# Patient Record
Sex: Male | Born: 1960 | Race: White | Hispanic: No | Marital: Single | Smoking: Current some day smoker
Health system: Southern US, Community
[De-identification: ages and names within clinical notes are randomized; demographics above are authoritative.]

## PROBLEM LIST (undated history)

## (undated) DIAGNOSIS — I2510 Atherosclerotic heart disease of native coronary artery without angina pectoris: Secondary | ICD-10-CM

## (undated) DIAGNOSIS — I739 Peripheral vascular disease, unspecified: Secondary | ICD-10-CM

---

## 2016-04-19 ENCOUNTER — Emergency Department (HOSPITAL_COMMUNITY): Payer: Self-pay

## 2016-04-19 ENCOUNTER — Encounter (HOSPITAL_COMMUNITY): Payer: Self-pay | Admitting: Emergency Medicine

## 2016-04-19 ENCOUNTER — Observation Stay (HOSPITAL_COMMUNITY)
Admission: EM | Admit: 2016-04-19 | Discharge: 2016-04-20 | Disposition: A | Discharge status: Home / Self Care | Payer: Self-pay | Attending: Internal Medicine | Admitting: Internal Medicine

## 2016-04-19 ENCOUNTER — Telehealth: Payer: Self-pay | Admitting: Student

## 2016-04-19 DIAGNOSIS — Z7982 Long term (current) use of aspirin: Secondary | ICD-10-CM | POA: Insufficient documentation

## 2016-04-19 DIAGNOSIS — R079 Chest pain, unspecified: Secondary | ICD-10-CM | POA: Diagnosis present

## 2016-04-19 DIAGNOSIS — Z720 Tobacco use: Secondary | ICD-10-CM | POA: Diagnosis present

## 2016-04-19 DIAGNOSIS — R7303 Prediabetes: Secondary | ICD-10-CM | POA: Insufficient documentation

## 2016-04-19 DIAGNOSIS — Z8249 Family history of ischemic heart disease and other diseases of the circulatory system: Secondary | ICD-10-CM | POA: Insufficient documentation

## 2016-04-19 DIAGNOSIS — Z86718 Personal history of other venous thrombosis and embolism: Secondary | ICD-10-CM | POA: Insufficient documentation

## 2016-04-19 DIAGNOSIS — I2584 Coronary atherosclerosis due to calcified coronary lesion: Secondary | ICD-10-CM | POA: Insufficient documentation

## 2016-04-19 DIAGNOSIS — I200 Unstable angina: Secondary | ICD-10-CM | POA: Diagnosis present

## 2016-04-19 DIAGNOSIS — Z7902 Long term (current) use of antithrombotics/antiplatelets: Secondary | ICD-10-CM | POA: Insufficient documentation

## 2016-04-19 DIAGNOSIS — Z955 Presence of coronary angioplasty implant and graft: Secondary | ICD-10-CM | POA: Insufficient documentation

## 2016-04-19 DIAGNOSIS — I700 Atherosclerosis of aorta: Secondary | ICD-10-CM | POA: Insufficient documentation

## 2016-04-19 DIAGNOSIS — I739 Peripheral vascular disease, unspecified: Secondary | ICD-10-CM | POA: Diagnosis present

## 2016-04-19 DIAGNOSIS — F17210 Nicotine dependence, cigarettes, uncomplicated: Secondary | ICD-10-CM | POA: Insufficient documentation

## 2016-04-19 DIAGNOSIS — I252 Old myocardial infarction: Secondary | ICD-10-CM

## 2016-04-19 DIAGNOSIS — Z8673 Personal history of transient ischemic attack (TIA), and cerebral infarction without residual deficits: Secondary | ICD-10-CM

## 2016-04-19 DIAGNOSIS — I25110 Atherosclerotic heart disease of native coronary artery with unstable angina pectoris: Principal | ICD-10-CM | POA: Insufficient documentation

## 2016-04-19 DIAGNOSIS — I2510 Atherosclerotic heart disease of native coronary artery without angina pectoris: Secondary | ICD-10-CM

## 2016-04-19 HISTORY — DX: Atherosclerotic heart disease of native coronary artery without angina pectoris: I25.10

## 2016-04-19 HISTORY — DX: Peripheral vascular disease, unspecified: I73.9

## 2016-04-19 LAB — CBC
HEMATOCRIT: 41.2 % (ref 39.0–52.0)
Hemoglobin: 14.1 g/dL (ref 13.0–17.0)
MCH: 31.5 pg (ref 26.0–34.0)
MCHC: 34.2 g/dL (ref 30.0–36.0)
MCV: 92 fL (ref 78.0–100.0)
Platelets: 240 10*3/uL (ref 150–400)
RBC: 4.48 MIL/uL (ref 4.22–5.81)
RDW: 13 % (ref 11.5–15.5)
WBC: 10.7 10*3/uL — AB (ref 4.0–10.5)

## 2016-04-19 LAB — TROPONIN I

## 2016-04-19 LAB — BASIC METABOLIC PANEL
Anion gap: 9 (ref 5–15)
BUN: 18 mg/dL (ref 6–20)
CHLORIDE: 108 mmol/L (ref 101–111)
CO2: 23 mmol/L (ref 22–32)
Calcium: 8.7 mg/dL — ABNORMAL LOW (ref 8.9–10.3)
Creatinine, Ser: 0.9 mg/dL (ref 0.61–1.24)
GFR calc non Af Amer: >60 mL/min (ref >60)
Glucose, Bld: 128 mg/dL — ABNORMAL HIGH (ref 65–99)
POTASSIUM: 4.1 mmol/L (ref 3.5–5.1)
SODIUM: 140 mmol/L (ref 135–145)

## 2016-04-19 LAB — D-DIMER, QUANTITATIVE (NOT AT ARMC): D DIMER QUANT: 0.58 ug{FEU}/mL — AB (ref 0.00–0.50)

## 2016-04-19 LAB — I-STAT TROPONIN, ED: Troponin i, poc: 0.02 ng/mL (ref 0.00–0.08)

## 2016-04-19 LAB — HEPARIN LEVEL (UNFRACTIONATED): Heparin Unfractionated: 0.18 IU/mL — ABNORMAL LOW (ref 0.30–0.70)

## 2016-04-19 MED ORDER — CLOPIDOGREL BISULFATE 75 MG PO TABS
75.0000 mg | ORAL_TABLET | Freq: Every day | ORAL | Status: DC
  Administered 2016-04-19 – 2016-04-20 (×2): 75 mg via ORAL
  Filled 2016-04-19 (×3): qty 1

## 2016-04-19 MED ORDER — NITROGLYCERIN 0.4 MG SL SUBL
0.4000 mg | SUBLINGUAL_TABLET | SUBLINGUAL | Status: DC | PRN
  Administered 2016-04-19: 0.4 mg via SUBLINGUAL
  Filled 2016-04-19: qty 1

## 2016-04-19 MED ORDER — SODIUM CHLORIDE 0.9 % IV SOLN
INTRAVENOUS | Status: DC
  Administered 2016-04-20: 06:00:00 via INTRAVENOUS

## 2016-04-19 MED ORDER — SODIUM CHLORIDE 0.9% FLUSH: 3.0000 mL | INTRAVENOUS | Status: DC | PRN

## 2016-04-19 MED ORDER — METOPROLOL TARTRATE 12.5 MG HALF TABLET
12.5000 mg | ORAL_TABLET | Freq: Two times a day (BID) | ORAL | Status: DC
  Administered 2016-04-20: 12.5 mg via ORAL
  Filled 2016-04-19: qty 1

## 2016-04-19 MED ORDER — HEPARIN (PORCINE) IN NACL 100-0.45 UNIT/ML-% IJ SOLN
1300.0000 [IU]/h | INTRAMUSCULAR | Status: DC
  Administered 2016-04-19: 950 [IU]/h via INTRAVENOUS
  Administered 2016-04-20: 1300 [IU]/h via INTRAVENOUS
  Filled 2016-04-19 (×2): qty 250

## 2016-04-19 MED ORDER — SODIUM CHLORIDE 0.9 % IV SOLN
250.0000 mL | INTRAVENOUS | Status: DC | PRN
Start: 2016-04-19 — End: 2016-04-20

## 2016-04-19 MED ORDER — ENOXAPARIN SODIUM 40 MG/0.4ML ~~LOC~~ SOLN: 40.0000 mg | SUBCUTANEOUS | Status: DC

## 2016-04-19 MED ORDER — ASPIRIN EC 81 MG PO TBEC
81.0000 mg | DELAYED_RELEASE_TABLET | Freq: Every day | ORAL | Status: DC
  Administered 2016-04-19 – 2016-04-20 (×2): 81 mg via ORAL
  Filled 2016-04-19 (×3): qty 1

## 2016-04-19 MED ORDER — SODIUM CHLORIDE 0.9 % IV SOLN
INTRAVENOUS | Status: AC
  Administered 2016-04-19: 09:00:00 via INTRAVENOUS

## 2016-04-19 MED ORDER — IOPAMIDOL (ISOVUE-370) INJECTION 76%
INTRAVENOUS | Status: AC
  Administered 2016-04-19: 100 mL
  Filled 2016-04-19: qty 100

## 2016-04-19 MED ORDER — SODIUM CHLORIDE 0.9% FLUSH: 3.0000 mL | Freq: Two times a day (BID) | INTRAVENOUS | Status: DC

## 2016-04-19 MED ORDER — HEPARIN BOLUS VIA INFUSION
4000.0000 [IU] | Freq: Once | INTRAVENOUS | Status: AC
Start: 2016-04-19 — End: 2016-04-19
  Administered 2016-04-19: 4000 [IU] via INTRAVENOUS
  Filled 2016-04-19: qty 4000

## 2016-04-19 MED ORDER — HEPARIN BOLUS VIA INFUSION
2000.0000 [IU] | Freq: Once | INTRAVENOUS | Status: AC
  Administered 2016-04-19: 2000 [IU] via INTRAVENOUS
  Filled 2016-04-19: qty 2000

## 2016-04-19 NOTE — Telephone Encounter (Signed)
New message    Pt did not have a heart Cath in 2004, she only had one in 2011, she asked if it was okay to send the heart cath from then, and I told her yes.

## 2016-04-19 NOTE — ED Provider Notes (Signed)
[facility]-EMERGENCY DEPT Provider Note   CSN: [id number] Arrival date & time: 04/19/16  0347     History   Chief Complaint Chief Complaint  Patient presents with  . Chest Pain    HPI Andrew Sherman is a 56 y.o. male.  Patient presents to the emergency department for evaluation of chest pain. Patient reports that he was awakened from sleep by chest pain. He cannot describe the pain, just a severe pain that was 10 out of 10, left upper chest. He reports associated diaphoresis and shortness of breath. Patient reports a previous history of MI. He was given nitroglycerin by EMS and has had significant improvement of the pain. He now reports that the pain only occurs when he takes a deep breath.      Past Medical History:  Diagnosis Date  . Myocardial infarction     There are no active problems to display for this patient.   No past surgical history on file.     Home Medications    Prior to Admission medications   Medication Sig Start Date End Date Taking? Authorizing Provider  aspirin EC 81 MG tablet Take 81 mg by mouth daily.   Yes Historical Provider, MD  clopidogrel (PLAVIX) 75 MG tablet Take 75 mg by mouth daily.   Yes Historical Provider, MD    Family History No family history on file.  Social History Social History  Substance Use Topics  . Smoking status: Current Some Day Smoker    Packs/day: 0.50    Types: Cigarettes  . Smokeless tobacco: Never Used  . Alcohol use 3.6 oz/week    6 Cans of beer per week     Allergies   Patient has no known allergies.   Review of Systems Review of Systems  Constitutional: Positive for diaphoresis.  Respiratory: Positive for shortness of breath.   Cardiovascular: Positive for chest pain.  All other systems reviewed and are negative.    Physical Exam Updated Vital Signs BP 128/78   Pulse 82   Temp 97.8 F (36.6 C) (Oral)   Resp (!) 23   SpO2 99%   Physical Exam  Constitutional: He is oriented to person, place,  and time. He appears well-developed and well-nourished. No distress.  HENT:  Head: Normocephalic and atraumatic.  Right Ear: Hearing normal.  Left Ear: Hearing normal.  Nose: Nose normal.  Mouth/Throat: Oropharynx is clear and moist and mucous membranes are normal.  Eyes: Conjunctivae and EOM are normal. Pupils are equal, round, and reactive to light.  Neck: Normal range of motion. Neck supple.  Cardiovascular: Regular rhythm, S1 normal and S2 normal.  Exam reveals no gallop and no friction rub.   No murmur heard. Pulmonary/Chest: Effort normal and breath sounds normal. No respiratory distress. He exhibits no tenderness.  Abdominal: Soft. Normal appearance and bowel sounds are normal. There is no hepatosplenomegaly. There is no tenderness. There is no rebound, no guarding, no tenderness at McBurney's point and negative Murphy's sign. No hernia.  Musculoskeletal: Normal range of motion.  Neurological: He is alert and oriented to person, place, and time. He has normal strength. No cranial nerve deficit or sensory deficit. Coordination normal. GCS eye subscore is 4. GCS verbal subscore is 5. GCS motor subscore is 6.  Skin: Skin is warm, dry and intact. No rash noted. No cyanosis.  Psychiatric: He has a normal mood and affect. His speech is normal and behavior is normal. Thought content normal.  Nursing note and vitals reviewed.  ED Treatments / Results  Labs (all labs ordered are listed, but only abnormal results are displayed) Labs Reviewed  BASIC METABOLIC PANEL - Abnormal; Notable for the following:       Result Value   Glucose, Bld 128 (*)    Calcium 8.7 (*)    All other components within normal limits  CBC - Abnormal; Notable for the following:    WBC 10.7 (*)    All other components within normal limits  D-DIMER, QUANTITATIVE (NOT AT [facility]) - Abnormal; Notable for the following:    D-Dimer, Quant 0.58 (*)    All other components within normal limits  I-STAT TROPOININ, ED     EKG  EKG Interpretation  Date/Time:  Thursday April 19 2016 03:47:48 EDT Ventricular Rate:  78 PR Interval:  166 QRS Duration: 72 QT Interval:  384 QTC Calculation: 437 R Axis:   6 Text Interpretation:  Normal sinus rhythm Normal ECG Confirmed by Blinda Leatherwood  MD, CHRISTOPHER ([id number]) on 04/19/2016 4:02:18 AM       Radiology Dg Chest 2 View  Result Date: 04/19/2016 CLINICAL DATA:  Mid chest pain tonight. EXAM: CHEST  2 VIEW COMPARISON:  None. FINDINGS: The cardiomediastinal contours are normal. The lungs are clear. Pulmonary vasculature is normal. No consolidation, pleural effusion, or pneumothorax. No acute osseous abnormalities are seen. Mild degenerative change in the thoracic spine. IMPRESSION: No acute abnormality. Electronically Signed   By: Rubye Oaks M.D.   On: 04/19/2016 05:19   Ct Angio Chest Pe W Or Wo Contrast  Result Date: 04/19/2016 CLINICAL DATA:  Chest pain and shortness of breath. History of right leg DVT. EXAM: CT ANGIOGRAPHY CHEST WITH CONTRAST TECHNIQUE: Multidetector CT imaging of the chest was performed using the standard protocol during bolus administration of intravenous contrast. Multiplanar CT image reconstructions and MIPs were obtained to evaluate the vascular anatomy. CONTRAST:  100 cc Isovue 370 IV COMPARISON:  Chest radiograph earlier this day FINDINGS: Cardiovascular: There are no filling defects within the pulmonary arteries to suggest pulmonary embolus. The thoracic aorta is normal in caliber. Minimal atherosclerosis. Coronary artery calcifications are seen. Mediastinum/Nodes: Calcified mediastinal on nodes left hilar lymph nodes consistent prior granulomatous disease. No noncalcified adenopathy. Visualized thyroid gland is normal. The esophagus is decompressed. Lungs/Pleura: Linear atelectasis in the left lower lobe. Mild central bronchial thickening. No consolidation or evidence pulmonary edema. No pleural fluid. No pulmonary mass or suspicious nodule.  Calcified granuloma in the left lower lobe. Upper Abdomen: Calcified splenic granuloma.  No acute abnormality. Musculoskeletal: There are no acute or suspicious osseous abnormalities. Degenerative change in the thoracic spine. Review of the MIP images confirms the above findings. IMPRESSION: 1. No pulmonary embolus. 2. Mild bronchial thickening. 3. Coronary artery calcifications. Mild atherosclerosis of the thoracic aorta. Electronically Signed   By: Rubye Oaks M.D.   On: 04/19/2016 06:41    Procedures Procedures (including critical care time)  Medications Ordered in ED Medications  iopamidol (ISOVUE-370) 76 % injection (100 mLs  Contrast Given 04/19/16 0622)     Initial Impression / Assessment and Plan / ED Course  I have reviewed the triage vital signs and the nursing notes.  Pertinent labs & imaging results that were available during my care of the patient were reviewed by me and considered in my medical decision making (see chart for details).     Patient presents to the emergency department for evaluation of chest pain. Patient does have a history of coronary artery disease. He reports previous cardiac stenting  in 2004. He does report that he had a stress test 1 or 2 years ago that did not show any abnormalities. This is reassuring. Patient did have most of his pain resolved with administration of aspirin and nitroglycerin by EMS. He still had some pleuritic pain here in the ER. D-dimer was slightly elevated, CT angiography performed. No PE noted. He does have cardiac calcifications. Patient with moderate risk, requires hospitalization for further cardiac evaluation.  Final Clinical Impressions(s) / ED Diagnoses   Final diagnoses:  Chest pain, unspecified type    New Prescriptions New Prescriptions   No medications on file     Gilda Creasehristopher J Pollina, MD 04/19/16 [phone]

## 2016-04-19 NOTE — ED Triage Notes (Signed)
Per EMS pt awoke from sleep approximately 2 hours ago with 10/10 chest pain.  Shortness of breath with movement.  Left sided, no radiation, no nausea or dizziness.  On plavix d/t previous clots in legs. History of MI in 2004.  Pt here from out of town for work.  324 ASA and 3 nitro given en route. Pain down to 7/10.  Smokes 1/2 pack per day.

## 2016-04-19 NOTE — Telephone Encounter (Signed)
SPOKE TO BRITTANY STRADER- PATIENT IS IN HOSPITAL AT PRESENT TIME BRITTANY STATES SHE HAS THE INFORMATION NOW

## 2016-04-19 NOTE — Progress Notes (Signed)
ANTICOAGULATION CONSULT NOTE  Pharmacy Consult for heparin Indication: chest pain/ACS  Heparin Dosing Weight: 77.1 kg   Assessment: 56 yom presenting with CP. Pharmacy consulted to dose heparin. PMH of "blood clots in legs" requiring stenting to both legs in 2004. Not on anticoagulation PTA. D-dimer 0.58. CTA negative for PE. Troponin negative. CBC wnl. No bleed documented. Estimated wt ~170 lbs in the ED.  Initial heparin level below goal at 0.18.   Goal of Therapy:  Heparin level 0.3-0.7 units/ml Monitor platelets by anticoagulation protocol: Yes   Plan:  Heparin 2000 unit bolus Increase heparin to 1100 units/hr 6hr heparin level Daily heparin level/CBC Monitor s/sx bleeding  Andrew CoilFrank Jaala Sherman PharmD., BCPS Clinical Pharmacist Pager [phone] 04/19/2016 5:16 PM

## 2016-04-19 NOTE — H&P (Signed)
Date: 04/19/2016               Patient Name:  Andrew Sherman MRN: [id number]  DOB: November 27, 1960 Age / Sex: 56 y.o., male   PCP: No Pcp Per Patient         Medical Service: Internal Medicine Teaching Service         Attending Physician: Dr. Earl Lagos, MD    First Contact: Dr. Candelaria StagersLendell Caprice Pager: [phone]  Second Contact: Dr. John Giovanni Pager: [phone]       After Hours (After 5p/  First Contact Pager: [phone]  weekends / holidays): Second Contact Pager: [phone]   Chief Complaint: chest pain  History of Present Illness: Andrew Sherman is a 56 year old man with history of MI s/p PCI, stroke, and PVD s/p PCI who presented to the ED with sudden-onset left-sided chest pain which awoke him at 2 AM. His pain was constant even as he went to use the bathroom and was associated with "a cold sweat" and difficulty breathing. He went downstairs in the hotel at which he is residing and called EMS. En route, he received aspirin 325 mg and sublingual nitroglycerin tablets x 3 which alleviated his pain. He denied any leg swelling, orthopnea, difficulty with exertion over the last several days prior to his admission.  This pain was different than the one he experienced in 2004 when he was hospitalized three times within the same month for "blood clots in my leg" which required stenting of both legs, stroke, and then a heart attack. He did follow-up with cardiology following this hospitalization though cannot recall how long ago or when he last had a stress test which was interpreted as low-risk. His only medications are aspirin 81 mg and clopidogrel 75 mg. He is unaware of family members who have had heart or lung disease. 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 He acknowledges smoking 1/2 pack/day for the last 20-30 years, even after his first heart attack, drinking beer [1 on weeknights/2 on the weekends], but denies any  illicit drug use.   In the ED, initial labwork noted leukocytosis 10,7, elevated D-dimer 58, negative point of care troponin 0.02. CTA was without findings of acute PE.   Meds:  Current Meds  Medication Sig  . aspirin EC 81 MG tablet Take 81 mg by mouth daily.  . clopidogrel (PLAVIX) 75 MG tablet Take 75 mg by mouth daily.   Allergies: Allergies as of 04/19/2016  . (No Known Allergies)   Past Medical History:  Diagnosis Date  . Myocardial infarction    Family History: As noted in the HPI.  Social History: As noted in the HPI.  Review of Systems: A complete ROS was negative except as per HPI.   Physical Exam: Blood pressure 136/86, pulse 73, temperature 97.8 F (36.6 C), temperature source Oral, resp. rate 13, SpO2 98 %. Physical Exam  Constitutional: He is oriented to person, place, and time. No distress.  HENT:  Head: Normocephalic and atraumatic.  Eyes: Conjunctivae are normal. No scleral icterus.  Neck: No JVD present.  Cardiovascular: Normal rate and regular rhythm.  Exam reveals no gallop and no friction rub.   No murmur heard. Difficult to palpate dorsalis pedis pulses bilaterally.  Pulmonary/Chest: Effort normal. No respiratory distress. He exhibits no tenderness.  Abdominal: Soft. He exhibits no distension.  Musculoskeletal: He exhibits no edema.  Neurological: He is alert and  oriented to person, place, and time.  Skin: Skin is warm. Rash (Dry, red flaky skin on bilateral lower extremities) noted. He is not diaphoretic.   EKG: I reviewed and compared with none prior. Normal sinus rhythm and axis. Q waves in aVL, possibly III.  CXR: I reviewed and compared with none prior. Interpretation limited by inspiratory effort. No infiltrate or consolidation noted.  Assessment & Plan by Problem: Principal Problem:   Chest pain Active Problems:   History of MI (myocardial infarction)   History of CVA (cerebrovascular accident)   PVD (peripheral vascular disease)  (HCC)   Tobacco abuse  Andrew Sherman is a 56 year old man with history of MI s/p PCI, stroke, and PVD s/p PCI hospitalized for chest pain.  Chest pain: ACS is suspect given ongoing tobacco abuse and prior history [MI, CVA, PVD] though reassuring that there are no EKG changes or positive biomarkers. Unclear why he is not on statin therapy for secondary prevention. CTA reassuring for no pulmonary embolism. No symptoms to suggest infectious etiology as well. -Monitor on telemetry -Check troponins x 3 -Start heparin IV -Repeat EKG in AM -Continue nitroglycerin sublingual prn for chest pain -Check A1c, lipid panel. Discuss statin therapy prior to discharge. -Consult Cardiology for cardiac catheterization given high pre-test probability -Continue ASA 81 mg  Peripheral vascular disease s/p PCI: Continue clopidogrel 75 mg daily and ASA as noted above.  History of stroke: ASA as noted above.  Tobacco abuse: Strongly encourage cessation.   #FEN:  -Diet: NPO except for sips with meds -NS 100 cc/hr x 12 as he received contrast for CTA earlier today  #DVT prophylaxis: heparin IV   #CODE STATUS: FULL CODE -Defer to friend Concha PyoDorothy Fend [phone]] if patients lacks decision-making capacity -Confirmed with patient on admission  Dispo: Admit patient to Observation with expected length of stay less than 2 midnights.  Signed: Beather Arbourushil Navpreet Szczygiel V, MD 04/19/2016, 8:53 AM  Pager: [phone]

## 2016-04-19 NOTE — Consult Note (Addendum)
Cardiology Consult    Patient ID: Andrew Sherman MRN: [id number], DOB/AGE: 58/01/1960   Admit date: 04/19/2016 Date of Consult: 04/20/2016  Primary Physician: No PCP Per Patient Reason for Consult: Chest Pain Primary Cardiologist: [facility] - Dr. SwazilandJordan Requesting Provider: Dr. Heide SparkNarendra   History of Present Illness    Andrew Sherman is a 56 y.o. male with past medical history of CAD, PVD (prior right iliac stenting in 2004), tobacco use, and alcohol use who [facility] for evaluation of chest pain.   In talking with the patient today, he reports awaking from sleep around 0100 with significant chest pain. Says it was the most intense pain he has ever experienced and was made worse with activity. Reports associated dyspnea and diaphoresis. No nausea or vomiting. He was given 3 SL NTG by EMS with improvement in his symptoms. Still reports a mild discomfort but "significantly improved" when compared to his initial presentation. Reports symptoms this AM were similar to when he required prior PCI.   Denies any orthopnea, PND, lower extremity edema, palpitations, or presyncope.   He had cardiac cath  at [facility] in [place] in 2011 after an abnormal cardiac CTA. He was found to have nonobstructive CAD. 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 He has not been seen by a PCP or Cardiologist in 10+ years.  He is unaware of any personal history of HTN, HLD, or Type 2 DM. Has continued on ASA and Plavix which [facility]. Smokes 0.5-1.0 ppd and consumes 3-4 beers per day. Denies any recreational drug use.   Initial labs show WBC 10.7, Hgb 14.1, platelets 240. Na+ 140, K+ 4.1, creatinine 0.90. D-dimer 0.58. Initial troponin and repeat value are both negative. CXR with no acute cardiopulmonary abnormalities. CTA with no evidence of PE but coronary calcifications with mild atherosclerosis of the  thoracic aorta noted. EKG shows NSR, HR 78, with no acute ST or T-wave changes (no prior tracings available for comparison).   Past Medical History   Past Medical History:  Diagnosis Date  . CAD (coronary artery disease)   . PVD (peripheral vascular disease) (HCC)     No past surgical history on file.   Allergies  No Known Allergies  Inpatient Medications    . [MAR Hold] aspirin EC  81 mg Oral Daily  . [MAR Hold] atorvastatin  40 mg Oral q1800  . [MAR Hold] clopidogrel  75 mg Oral Daily  . [MAR Hold] metoprolol tartrate  12.5 mg Oral BID  . sodium chloride flush  3 mL Intravenous Q12H    Family History    Family History  Problem Relation Age of Onset  . Hypertension Father     Social History    Social History   Social History  . Marital status: Single    Spouse name: N/A  . Number of children: N/A  . Years of education: N/A   Occupational History  . Not on file.   Social History Main Topics  . Smoking status: Current Some Day Smoker    Packs/day: 0.50    Years: 30.00    Types: Cigarettes  . Smokeless tobacco: Never Used  . Alcohol use 12.6 oz/week    21 Cans of beer per week  . Drug use: No  . Sexual activity: Not on file   Other Topics Concern  . Not on file  Social History Narrative  . No narrative on file     Review of Systems    General:  No chills, fever, night sweats or weight changes.  Cardiovascular:  No edema, orthopnea, palpitations, paroxysmal nocturnal dyspnea. Positive for chest pain and dyspnea on exertion.  Dermatological: No rash, lesions/masses Respiratory: No cough, dyspnea Urologic: No hematuria, dysuria Abdominal:   No nausea, vomiting, diarrhea, bright red blood per rectum, melena, or hematemesis Neurologic:  No visual changes, wkns, changes in mental status. All other systems reviewed and are otherwise negative except as noted above.  Physical Exam    Blood pressure (!) 143/87, pulse 73, temperature 98.3 F (36.8 C),  temperature source Oral, resp. rate 18, height 5\' 10"  (1.778 m), weight 184 lb (83.5 kg), SpO2 96 %.  General: Pleasant, Caucasian male appearing in NAD. Psych: Normal affect. Neuro: Alert and oriented X 3. Moves all extremities spontaneously. HEENT: Normal  Neck: Supple without bruits or JVD. Lungs:  Resp regular and unlabored, CTA without wheezing or rales. Heart: RRR no s3, s4, or murmurs. Abdomen: Soft, non-tender, non-distended, BS + x 4.  Extremities: No clubbing, cyanosis or edema. DP/PT/Radials 2+ and equal bilaterally.  Labs    Troponin [facility] Test)  Recent Labs  04/19/16 0409  TROPIPOC 0.02    Recent Labs  04/19/16 0822 04/19/16 1432 04/19/16 1952  TROPONINI <0.03 <0.03 <0.03   Lab Results  Component Value Date   WBC 7.7 04/20/2016   HGB 13.8 04/20/2016   HCT 41.0 04/20/2016   MCV 91.1 04/20/2016   PLT 243 04/20/2016     Recent Labs Lab 04/20/16 0006  NA 141  K 3.8  CL 107  CO2 25  BUN 10  CREATININE 0.79  CALCIUM 8.8*  GLUCOSE 106*   Lab Results  Component Value Date   CHOL 165 04/20/2016   HDL 45 04/20/2016   LDLCALC 106 (H) 04/20/2016   TRIG 70 04/20/2016   Lab Results  Component Value Date   DDIMER 0.58 (H) 04/19/2016     Radiology Studies    Dg Chest 2 View  Result Date: 04/19/2016 CLINICAL DATA:  Mid chest pain tonight. EXAM: CHEST  2 VIEW COMPARISON:  None. FINDINGS: The cardiomediastinal contours are normal. The lungs are clear. Pulmonary vasculature is normal. No consolidation, pleural effusion, or pneumothorax. No acute osseous abnormalities are seen. Mild degenerative change in the thoracic spine. IMPRESSION: No acute abnormality. Electronically Signed   By: Rubye Oaks M.D.   On: 04/19/2016 05:19   Ct Angio Chest Pe W Or Wo Contrast  Result Date: 04/19/2016 CLINICAL DATA:  Chest pain and shortness of breath. History of right leg DVT. EXAM: CT ANGIOGRAPHY CHEST WITH CONTRAST TECHNIQUE: Multidetector CT imaging of  the chest was performed using the standard protocol during bolus administration of intravenous contrast. Multiplanar CT image reconstructions and MIPs were obtained to evaluate the vascular anatomy. CONTRAST:  100 cc Isovue 370 IV COMPARISON:  Chest radiograph earlier this day FINDINGS: Cardiovascular: There are no filling defects within the pulmonary arteries to suggest pulmonary embolus. The thoracic aorta is normal in caliber. Minimal atherosclerosis. Coronary artery calcifications are seen. Mediastinum/Nodes: Calcified mediastinal on nodes left hilar lymph nodes consistent prior granulomatous disease. No noncalcified adenopathy. Visualized thyroid gland is normal. The esophagus is decompressed. Lungs/Pleura: Linear atelectasis in the left lower lobe. Mild central bronchial thickening. No consolidation or evidence pulmonary edema. No pleural fluid. No pulmonary mass or suspicious nodule. Calcified granuloma in the left lower lobe. Upper  Abdomen: Calcified splenic granuloma.  No acute abnormality. Musculoskeletal: There are no acute or suspicious osseous abnormalities. Degenerative change in the thoracic spine. Review of the MIP images confirms the above findings. IMPRESSION: 1. No pulmonary embolus. 2. Mild bronchial thickening. 3. Coronary artery calcifications. Mild atherosclerosis of the thoracic aorta. Electronically Signed   By: Rubye Oaks M.D.   On: 04/19/2016 06:41    EKG & Cardiac Imaging    EKG: NSR, HR 78, with no acute ST or T-wave changes (no prior tracings available for comparison) - Personally Reviewed  Echocardiogram: None on File  Assessment & Plan    1. Chest Pain concerning for Unstable Angina - awoke from sleep around 0100 with significant chest pain with associated dyspnea and diaphoresis. Symptoms significantly improved following administration of  3 SL NTG by EMS. Symptoms similar to 2004. Pain not exacerbated by positional changes.  -  D-dimer 0.58 with CTA negative for  PE. Initial troponin and repeat values are both negative. CTA does show coronary calcifications with mild atherosclerosis of the thoracic aorta noted. EKG with no acute ischemic changes.  - Lipid Panel pending. Recommend initiation of statin therapy if LDL > 70. Continued on PTA ASA and Plavix. Should be on BB therapy with known CAD --> start Lopressor 12.5mg  BID.  - his symptoms are certainly concerning for UA as they awoke him from sleep and were similar to when he required prior PCI. Has not been seen by a Cardiologist or PCP in 10+ years (Lipid Panel and Hgb A1c pending). Will discuss further evaluation with Dr. Swaziland but would anticipate a cardiac catheterization this admission for definitive evaluation (likely tomorrow as cath schedule is full today).   2. CAD - s/p cath in 2011. Will request records from [facility].  - continue ASA and Plavix. Start BB as above. Lipid Panel pending.   3. PVD - prior right iliac stenting in 2004.  - denies any recent claudication symptoms.  - continue ASA and Plavix.   4. Tobacco use/ Alcohol use - smokes 0.5-1.0 ppd (30 pack-year history) and consumes 3-4 beers per day.  - cessation advised.    Signed, Miki Labuda [place], PA-C 04/20/2016, 7:32 AM Pager: [phone] Patient seen and examined and history reviewed. Agree with above findings and plan. 56 yo WM with history of CAD - initially reported having a stent placed in 2004. However, when we received records from [place] he only had a cardiac cath in 2011 showing nonobstructive CAD. Also history of PAD s/p right iliac stent. Ongoing tobacco abuse. On ASA and Plavix but otherwise on no medication. Awoke with severe left precordial pain. No radiation. 10/10. Associated with SOB and diaphoresis. States it is the worst pain he has ever had. Relieved after SL Ntg x 3. Patient does describe some chronic claudication symptoms. Tried to quit smoking in past using Chantix without success.  On exam he is in  no distress No JVD or carotid bruits. Lungs clear CV RRR without gallop or murmur.  Ecg shows NSR with normal Ecg. I have personally reviewed and interpreted this study. Troponin initially normal CT chest shows severe calcification in LAD- I suspect this is location of stent  Impression:  Unstable angina in patient at high risk for cardiac cause of chest pain. - will admit. Start IV heparin, beta blocker, statin.  - plan cardiac cath tomorrow with possible PCi. The procedure and risks were reviewed including but not limited to death, myocardial infarction, stroke, arrythmias, bleeding, transfusion, emergency surgery,  dye allergy, or renal dysfunction. The patient voices understanding and is agreeable to proceed.  PAD. Stable claudication. Recommend LE arterial dopplers at some point  Tobacco abuse- counseled on smoking cessation  Lipid status unknown. Will check panel. Needs statin therapy.  Andrew Sherman, MDFACC 04/20/2016 7:32 AM

## 2016-04-19 NOTE — Progress Notes (Signed)
Transitions of Care Pharmacy Note  Plan:  Educated on lopressor addition, ADE, monitoring; ?statin addition pending lipid panel; importance of smoking cessation Recommend high intensity statin considering patient has clinical ASCVD Follow-up start of statin --------------------------------------------- Andrew Sherman is an 56 y.o. male who presents with a chief complaint CP. In anticipation of discharge, pharmacy has reviewed this patient's prior to admission medication history, as well as [facility] inpatient [facility].   Current medication indications, dosing, frequency, and notable side effects reviewed with patient. patient verbalized understanding of current inpatient medication regimen and is aware that the After Visit Summary when presented, will represent the most accurate medication list at discharge.   Andrew Sherman expressed no concerns. Educated on MOA of lopressor and ADE/monitoring associated. Educated on MOA and benefit of statin therapy beyond cholesterol lowering potential. Patient not interested in smoking cessation at this time.    Assessment: Understanding of regimen: good Understanding of indications: good Potential of compliance: good Barriers to Obtaining Medications: No  Patient instructed to contact inpatient pharmacy team with further questions or concerns if needed.    Time spent preparing for discharge counseling: 10 mins Time spent counseling patient: 20 mins   Thank you for allowing pharmacy to be a part of this patient's care.  Allena Katzaroline E Evany Schecter, Pharm.D. PGY1 Pharmacy Resident 3/29/20186:41 PM Pager [phone]

## 2016-04-19 NOTE — Progress Notes (Signed)
ANTICOAGULATION CONSULT NOTE  Pharmacy Consult for heparin Indication: chest pain/ACS  Heparin Dosing Weight: 77.1 kg   Assessment: 56 yom presenting with CP. Pharmacy consulted to dose heparin. PMH of "blood clots in legs" requiring stenting to both legs in 2004. Not on anticoagulation PTA. D-dimer 0.58. CTA negative for PE. Troponin negative. CBC wnl. No bleed documented. Estimated wt ~170 lbs in the ED.  Goal of Therapy:  Heparin level 0.3-0.7 units/ml Monitor platelets by anticoagulation protocol: Yes   Plan:  Heparin 4000 unit bolus Start heparin at 950 units/h 6h heparin level Daily heparin level/CBC Monitor s/sx bleeding   Babs BertinHaley Tushar Enns, PharmD, BCPS Clinical Pharmacist 04/19/2016 10:11 AM

## 2016-04-19 NOTE — ED Notes (Signed)
Heparin verified with Manuela Schwartzuth RN

## 2016-04-20 ENCOUNTER — Encounter (HOSPITAL_COMMUNITY)
Admission: EM | Disposition: A | Discharge status: Home / Self Care | Payer: Self-pay | Source: Home / Self Care | Attending: Emergency Medicine

## 2016-04-20 ENCOUNTER — Encounter (HOSPITAL_COMMUNITY): Payer: Self-pay | Admitting: Cardiology

## 2016-04-20 DIAGNOSIS — I25110 Atherosclerotic heart disease of native coronary artery with unstable angina pectoris: Secondary | ICD-10-CM

## 2016-04-20 DIAGNOSIS — R7303 Prediabetes: Secondary | ICD-10-CM

## 2016-04-20 DIAGNOSIS — Z8673 Personal history of transient ischemic attack (TIA), and cerebral infarction without residual deficits: Secondary | ICD-10-CM

## 2016-04-20 DIAGNOSIS — F17200 Nicotine dependence, unspecified, uncomplicated: Secondary | ICD-10-CM

## 2016-04-20 HISTORY — PX: LEFT HEART CATH AND CORONARY ANGIOGRAPHY: CATH118249

## 2016-04-20 LAB — HEMOGLOBIN A1C
HEMOGLOBIN A1C: 5.8 % — AB (ref 4.8–5.6)
MEAN PLASMA GLUCOSE: 120 mg/dL

## 2016-04-20 LAB — LIPID PANEL
CHOL/HDL RATIO: 3.7 ratio
Cholesterol: 165 mg/dL (ref 0–200)
HDL: 45 mg/dL (ref >40)
LDL Cholesterol: 106 mg/dL — ABNORMAL HIGH (ref 0–99)
Triglycerides: 70 mg/dL (ref <150)
VLDL: 14 mg/dL (ref 0–40)

## 2016-04-20 LAB — BASIC METABOLIC PANEL
Anion gap: 9 (ref 5–15)
BUN: 10 mg/dL (ref 6–20)
CALCIUM: 8.8 mg/dL — AB (ref 8.9–10.3)
CHLORIDE: 107 mmol/L (ref 101–111)
CO2: 25 mmol/L (ref 22–32)
CREATININE: 0.79 mg/dL (ref 0.61–1.24)
GFR calc Af Amer: >60 mL/min (ref >60)
GFR calc non Af Amer: >60 mL/min (ref >60)
Glucose, Bld: 106 mg/dL — ABNORMAL HIGH (ref 65–99)
Potassium: 3.8 mmol/L (ref 3.5–5.1)
SODIUM: 141 mmol/L (ref 135–145)

## 2016-04-20 LAB — CBC
HCT: 41 % (ref 39.0–52.0)
Hemoglobin: 13.8 g/dL (ref 13.0–17.0)
MCH: 30.7 pg (ref 26.0–34.0)
MCHC: 33.7 g/dL (ref 30.0–36.0)
MCV: 91.1 fL (ref 78.0–100.0)
Platelets: 243 10*3/uL (ref 150–400)
RBC: 4.5 MIL/uL (ref 4.22–5.81)
RDW: 13 % (ref 11.5–15.5)
WBC: 7.7 10*3/uL (ref 4.0–10.5)

## 2016-04-20 LAB — HEPARIN LEVEL (UNFRACTIONATED): Heparin Unfractionated: 0.23 IU/mL — ABNORMAL LOW (ref 0.30–0.70)

## 2016-04-20 LAB — HIV ANTIBODY (ROUTINE TESTING W REFLEX): HIV Screen 4th Generation wRfx: NONREACTIVE

## 2016-04-20 SURGERY — LEFT HEART CATH AND CORONARY ANGIOGRAPHY
Anesthesia: LOCAL

## 2016-04-20 MED ORDER — NITROGLYCERIN 0.4 MG SL SUBL: 0.4000 mg | SUBLINGUAL_TABLET | SUBLINGUAL | 0 refills | Status: AC | PRN

## 2016-04-20 MED ORDER — METOPROLOL SUCCINATE ER 25 MG PO TB24: 25.0000 mg | ORAL_TABLET | Freq: Every day | ORAL | 0 refills | Status: AC

## 2016-04-20 MED ORDER — MIDAZOLAM HCL 2 MG/2ML IJ SOLN
INTRAMUSCULAR | Status: DC | PRN
  Administered 2016-04-20: 1 mg via INTRAVENOUS

## 2016-04-20 MED ORDER — SODIUM CHLORIDE 0.9% FLUSH: 3.0000 mL | INTRAVENOUS | Status: DC | PRN

## 2016-04-20 MED ORDER — SODIUM CHLORIDE 0.9 % WEIGHT BASED INFUSION: 1.0000 mL/kg/h | INTRAVENOUS | Status: AC

## 2016-04-20 MED ORDER — SODIUM CHLORIDE 0.9 % IV SOLN: 250.0000 mL | INTRAVENOUS | Status: DC | PRN

## 2016-04-20 MED ORDER — HEPARIN SODIUM (PORCINE) 1000 UNIT/ML IJ SOLN
INTRAMUSCULAR | Status: DC | PRN
  Administered 2016-04-20: 4500 [IU] via INTRAVENOUS

## 2016-04-20 MED ORDER — IOPAMIDOL (ISOVUE-370) INJECTION 76%
INTRAVENOUS | Status: DC | PRN
  Administered 2016-04-20: 80 mL via INTRA_ARTERIAL

## 2016-04-20 MED ORDER — HEPARIN (PORCINE) IN NACL 2-0.9 UNIT/ML-% IJ SOLN
INTRAMUSCULAR | Status: DC | PRN
  Administered 2016-04-20: 08:00:00 via INTRA_ARTERIAL

## 2016-04-20 MED ORDER — FENTANYL CITRATE (PF) 100 MCG/2ML IJ SOLN
INTRAMUSCULAR | Status: AC
  Filled 2016-04-20: qty 2

## 2016-04-20 MED ORDER — SODIUM CHLORIDE 0.9% FLUSH: 3.0000 mL | Freq: Two times a day (BID) | INTRAVENOUS | Status: DC

## 2016-04-20 MED ORDER — LIDOCAINE HCL (PF) 1 % IJ SOLN
INTRAMUSCULAR | Status: DC | PRN
  Administered 2016-04-20: 2 mL via INTRADERMAL

## 2016-04-20 MED ORDER — HEPARIN (PORCINE) IN NACL 2-0.9 UNIT/ML-% IJ SOLN
INTRAMUSCULAR | Status: DC | PRN
  Administered 2016-04-20: 1000 mL via INTRA_ARTERIAL

## 2016-04-20 MED ORDER — HEPARIN SODIUM (PORCINE) 1000 UNIT/ML IJ SOLN
INTRAMUSCULAR | Status: AC
  Filled 2016-04-20: qty 1

## 2016-04-20 MED ORDER — VERAPAMIL HCL 2.5 MG/ML IV SOLN
INTRAVENOUS | Status: AC
  Filled 2016-04-20: qty 2

## 2016-04-20 MED ORDER — LIDOCAINE HCL (PF) 1 % IJ SOLN
INTRAMUSCULAR | Status: AC
  Filled 2016-04-20: qty 30

## 2016-04-20 MED ORDER — HEPARIN (PORCINE) IN NACL 2-0.9 UNIT/ML-% IJ SOLN
INTRAMUSCULAR | Status: AC
  Filled 2016-04-20: qty 1000

## 2016-04-20 MED ORDER — FENTANYL CITRATE (PF) 100 MCG/2ML IJ SOLN
INTRAMUSCULAR | Status: DC | PRN
  Administered 2016-04-20: 25 ug via INTRAVENOUS

## 2016-04-20 MED ORDER — ATORVASTATIN CALCIUM 40 MG PO TABS: 40.0000 mg | ORAL_TABLET | Freq: Every day | ORAL | 0 refills | Status: AC

## 2016-04-20 MED ORDER — ATORVASTATIN CALCIUM 40 MG PO TABS: 40.0000 mg | ORAL_TABLET | Freq: Every day | ORAL | Status: DC

## 2016-04-20 MED ORDER — IOPAMIDOL (ISOVUE-370) INJECTION 76%
INTRAVENOUS | Status: AC
  Filled 2016-04-20: qty 100

## 2016-04-20 MED ORDER — MIDAZOLAM HCL 2 MG/2ML IJ SOLN
INTRAMUSCULAR | Status: AC
  Filled 2016-04-20: qty 2

## 2016-04-20 SURGICAL SUPPLY — 10 items

## 2016-04-20 NOTE — Progress Notes (Signed)
ANTICOAGULATION CONSULT NOTE - Follow Up Consult  Pharmacy Consult for Heparin  Indication: chest pain/ACS  No Known Allergies  Patient Measurements: Height:  (177.8 cm) Weight: 184 lb (83.5 kg) IBW/kg (Calculated) : 73  Vital Signs: Temp: 98 F (36.7 C) (03/29 2058) Temp Source: Oral (03/29 2058) BP: 140/76 (03/29 2058) Pulse Rate: 72 (03/29 2058)  Labs:  Recent Labs  04/19/16 0350 04/19/16 0822 04/19/16 1432 04/19/16 1635 04/19/16 1952 04/20/16 0006  HGB 14.1  --   --   --   --  13.8  HCT 41.2  --   --   --   --  41.0  PLT 240  --   --   --   --  243  HEPARINUNFRC  --   --   --  0.18*  --  0.23*  CREATININE 0.90  --   --   --   --   --   TROPONINI  --  <0.03 <0.03  --  <0.03  --     Estimated Creatinine Clearance: 92.4 mL/min (by C-G formula based on SCr of 0.9 mg/dL).   Assessment: Heparin for CP, likely cath today, heparin level remains sub-therapeutic, no issues per RN.   Goal of Therapy:  Heparin level 0.3-0.7 units/ml Monitor platelets by anticoagulation protocol: Yes   Plan:  -Inc heparin to 1300 units/hr -0900 HL  Andrew Sherman 04/20/2016,12:54 AM

## 2016-04-20 NOTE — Progress Notes (Signed)
Subjective: Feels well with no further chest pain of shortness of breath.  Tolerated cardiac catheterization well this morning, no bleeding from R radial puncture site.  Objective:  Vital signs in last 24 hours: Vitals:   04/19/16 1400 04/19/16 1426 04/19/16 2058 04/20/16 0315  BP: (!) 149/96 137/77 140/76 (!) 143/87  Pulse: 81  72 73  Resp:  Temp:  97.5 F (36.4 C) 98 F (36.7 C) 98.3 F (36.8 C)  TempSrc:  Oral Oral Oral  SpO2: 97% 100% 97% 97%  Weight:  184 lb (83.5 kg)    Height:   (1.778 m)     Physical Exam  Constitutional: He is oriented to person, place, and time. He appears well-developed and well-nourished. No distress.  Cardiovascular: Normal rate and regular rhythm.   Pulmonary/Chest: Effort normal and breath sounds normal.  Neurological: He is alert and oriented to person, place, and time.  Psychiatric: He has a normal mood and affect. His behavior is normal.   CBC Latest Ref Rng & Units 04/20/2016 04/19/2016  WBC 4.0 - 10.5 K/uL 7.7 10.7(H)  Hemoglobin 13.0 - 17.0 g/dL 40.9 81.1  Hematocrit 91.4 - 52.0 % 41.0 41.2  Platelets 150 - 400 K/uL 243 240   CMP Latest Ref Rng & Units 04/20/2016 04/19/2016  Glucose 65 - 99 mg/dL 782(N) 562(Z)  BUN 6 - 20 mg/dL 10 18  Creatinine 3.08 - 1.24 mg/dL 6.57 8.46  Sodium 962 - 145 mmol/L 141 140  Potassium 3.5 - 5.1 mmol/L 3.8 4.1  Chloride 101 - 111 mmol/L 107 108  CO2 22 - 32 mmol/L 25 23  Calcium 8.9 - 10.3 mg/dL 9.5(M) 8.4(X)   Lipid Panel     Component Value Date/Time   CHOL 165 04/20/2016 0006   TRIG 70 04/20/2016 0006   HDL 45 04/20/2016 0006   CHOLHDL 3.7 04/20/2016 0006   VLDL 14 04/20/2016 0006   LDLCALC 106 (H) 04/20/2016 0006   Cardiac Panel (last 3 results)  Recent Labs  04/19/16 0822 04/19/16 1432 04/19/16 1952  TROPONINI <0.03 <0.03 <0.03   Left Heart Cath and Coronary Angiography 04/20/2016 By Dr Peter [place]  Prox LAD to Mid LAD lesion, 25 %stenosed.  Prox RCA to Mid RCA  lesion, 10 %stenosed.  The left ventricular ejection fraction is 50-55% by visual estimate.  The left ventricular systolic function is normal.  LV end diastolic pressure is normal.   1. Nonobstructive CAD 2. Low normal LV systolic function 3. Normal LVEDP  Plan: medical management with risk factor modification. Patient may be DC today post TR band removal. Recommend follow up in our office. Will need LE and Abdominal dopplers as outpatient to follow up on PAD.  Assessment/Plan:  Principal Problem:   Unstable angina (HCC) Active Problems:   Chest pain   History of MI (myocardial infarction)   History of CVA (cerebrovascular accident)   PVD (peripheral vascular disease) (HCC)   Tobacco abuse   56 y.o. male with CAD, PAD, tobacco abuse, and history of CVA who presented with sudden onset substernal chest pain concerning for ACS vs unstable angina.  MI was ruled out with serial negative troponins and nonischemic EKG, and cardiac catheterization showed non-obstructive CAD.  Will medically optimized and plan to discharge with IM and cardiology follow-up.  #Chest Pain #CAD #PAD No acute MI, nonobstructive CAD. -Add high-intensity statin, BB -Continue aspirin and clopidogrel -Tobacco   Fluids: none Diet: heart DVT Prophylaxis: lovenox Code Status: full  Dispo: Anticipated discharge today.  Alm Bustard, MD 04/20/2016, 6:36 AM Pager: [phone]

## 2016-04-20 NOTE — H&P (View-Only) (Signed)
Cardiology Consult    Patient ID: Andrew Sherman MRN: [id number], DOB/AGE: 05/06/1960   Admit date: 04/19/2016 Date of Consult: 04/19/2016  Primary Physician: No PCP Per Patient Reason for Consult: Chest Pain Primary Cardiologist: New to [facility] - Dr. Swaziland Requesting Provider: Dr. Heide Spark   History of Present Illness    Andrew Sherman is a 56 y.o. male with past medical history of CAD (s/p prior stent placement in 2004), PVD (prior right iliac stenting in 2004), tobacco use, and alcohol use who presented to [facility] ED on 04/19/2016 for evaluation of chest pain.   In talking with the patient today, he reports awaking from sleep around 0100 with significant chest pain. Says it was the most intense pain he has ever experienced and was made worse with activity. Reports associated dyspnea and diaphoresis. No nausea or vomiting. He was given 3 SL NTG by EMS with improvement in his symptoms. Still reports a mild discomfort but "significantly improved" when compared to his initial presentation. Reports symptoms this AM were similar to when he required prior PCI.   Denies any orthopnea, PND, lower extremity edema, palpitations, or presyncope.   He had prior stent placement in 2004 at [facility] in [place]. 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 He has not been seen by a PCP or Cardiologist in 10+ years.  He is unaware of any personal history of HTN, HLD, or Type 2 DM. Has continued on ASA and Plav[facility]. Smokes 0.5-1.0 ppd and consumes 3-4 beers per day. Denies any recreational drug use.   Initial labs show WBC 10.7, Hgb 14.1, platelets 240. Na+ 140, K+ 4.1, creatinine 0.90. D-dimer 0.58. Initial troponin and repeat value are both negative. CXR with no acute cardiopulmonary abnormalities. CTA with no evidence of PE but coronary calcifications with mild atherosclerosis of the thoracic aorta noted. EKG  shows NSR, HR 78, with no acute ST or T-wave changes (no prior tracings available for comparison).   Past Medical History   Past Medical History:  Diagnosis Date  . CAD (coronary artery disease)    a. prior stenting in 2004  . PVD (peripheral vascular disease) (HCC)     No past surgical history on file.   Allergies  No Known Allergies  Inpatient Medications    . aspirin EC  81 mg Oral Daily  . clopidogrel  75 mg Oral Daily    Family History    Family History  Problem Relation Age of Onset  . Hypertension Father     Social History    Social History   Social History  . Marital status: Single    Spouse name: N/A  . Number of children: N/A  . Years of education: N/A   Occupational History  . Not on file.   Social History Main Topics  . Smoking status: Current Some Day Smoker    Packs/day: 0.50    Years: 30.00    Types: Cigarettes  . Smokeless tobacco: Never Used  . Alcohol use 12.6 oz/week    21 Cans of beer per week  . Drug use: No  . Sexual activity: Not on file   Other Topics Concern  . Not on file   Social History Narrative  . No narrative on file     Review of Systems    General:  No chills, fever, night sweats or weight changes.  Cardiovascular:  No edema,  orthopnea, palpitations, paroxysmal nocturnal dyspnea. Positive for chest pain and dyspnea on exertion.  Dermatological: No rash, lesions/masses Respiratory: No cough, dyspnea Urologic: No hematuria, dysuria Abdominal:   No nausea, vomiting, diarrhea, bright red blood per rectum, melena, or hematemesis Neurologic:  No visual changes, wkns, changes in mental status. All other systems reviewed and are otherwise negative except as noted above.  Physical Exam    Blood pressure (!) 153/88, pulse 76, temperature 97.8 F (36.6 C), temperature source Oral, resp. rate 13, weight 170 lb (77.1 kg), SpO2 98 %.  General: Pleasant, Caucasian male appearing in NAD. Psych: Normal affect. Neuro: Alert  and oriented X 3. Moves all extremities spontaneously. HEENT: Normal  Neck: Supple without bruits or JVD. Lungs:  Resp regular and unlabored, CTA without wheezing or rales. Heart: RRR no s3, s4, or murmurs. Abdomen: Soft, non-tender, non-distended, BS + x 4.  Extremities: No clubbing, cyanosis or edema. DP/PT/Radials 2+ and equal bilaterally.  Labs    Troponin [facility] Test)  Recent Labs  04/19/16 0409  TROPIPOC 0.02    Recent Labs  04/19/16 0822  TROPONINI <0.03   Lab Results  Component Value Date   WBC 10.7 (H) 04/19/2016   HGB 14.1 04/19/2016   HCT 41.2 04/19/2016   MCV 92.0 04/19/2016   PLT 240 04/19/2016     Recent Labs Lab 04/19/16 0350  NA 140  K 4.1  CL 108  CO2 23  BUN 18  CREATININE 0.90  CALCIUM 8.7*  GLUCOSE 128*   No results found for: CHOL, HDL, LDLCALC, TRIG Lab Results  Component Value Date   DDIMER 0.58 (H) 04/19/2016     Radiology Studies    Dg Chest 2 View  Result Date: 04/19/2016 CLINICAL DATA:  Mid chest pain tonight. EXAM: CHEST  2 VIEW COMPARISON:  None. FINDINGS: The cardiomediastinal contours are normal. The lungs are clear. Pulmonary vasculature is normal. No consolidation, pleural effusion, or pneumothorax. No acute osseous abnormalities are seen. Mild degenerative change in the thoracic spine. IMPRESSION: No acute abnormality. Electronically Signed   By: Rubye Oaks M.D.   On: 04/19/2016 05:19   Ct Angio Chest Pe W Or Wo Contrast  Result Date: 04/19/2016 CLINICAL DATA:  Chest pain and shortness of breath. History of right leg DVT. EXAM: CT ANGIOGRAPHY CHEST WITH CONTRAST TECHNIQUE: Multidetector CT imaging of the chest was performed using the standard protocol during bolus administration of intravenous contrast. Multiplanar CT image reconstructions and MIPs were obtained to evaluate the vascular anatomy. CONTRAST:  100 cc Isovue 370 IV COMPARISON:  Chest radiograph earlier this day FINDINGS: Cardiovascular: There are no  filling defects within the pulmonary arteries to suggest pulmonary embolus. The thoracic aorta is normal in caliber. Minimal atherosclerosis. Coronary artery calcifications are seen. Mediastinum/Nodes: Calcified mediastinal on nodes left hilar lymph nodes consistent prior granulomatous disease. No noncalcified adenopathy. Visualized thyroid gland is normal. The esophagus is decompressed. Lungs/Pleura: Linear atelectasis in the left lower lobe. Mild central bronchial thickening. No consolidation or evidence pulmonary edema. No pleural fluid. No pulmonary mass or suspicious nodule. Calcified granuloma in the left lower lobe. Upper Abdomen: Calcified splenic granuloma.  No acute abnormality. Musculoskeletal: There are no acute or suspicious osseous abnormalities. Degenerative change in the thoracic spine. Review of the MIP images confirms the above findings. IMPRESSION: 1. No pulmonary embolus. 2. Mild bronchial thickening. 3. Coronary artery calcifications. Mild atherosclerosis of the thoracic aorta. Electronically Signed   By: Rubye Oaks M.D.   On: 04/19/2016 06:41  EKG & Cardiac Imaging    EKG: NSR, HR 78, with no acute ST or T-wave changes (no prior tracings available for comparison) - Personally Reviewed  Echocardiogram: None on File  Assessment & Plan    1. Chest Pain concerning for Unstable Angina - awoke from sleep around 0100 with significant chest pain with associated dyspnea and diaphoresis. Symptoms significantly improved following administration of  3 SL NTG by EMS. Symptoms similar to 2004. Pain not exacerbated by positional changes.  -  D-dimer 0.58 with CTA negative for PE. Initial troponin and repeat values are both negative. CTA does show coronary calcifications with mild atherosclerosis of the thoracic aorta noted. EKG with no acute ischemic changes.  - Lipid Panel pending. Recommend initiation of statin therapy if LDL > 70. Continued on PTA ASA and Plavix. Should be on BB  therapy with known CAD --> start Lopressor 12.5mg  BID.  - his symptoms are certainly concerning for UA as they awoke him from sleep and were similar to when he required prior PCI. Has not been seen by a Cardiologist or PCP in 10+ years (Lipid Panel and Hgb A1c pending). Will discuss further evaluation with Dr. Swaziland but would anticipate a cardiac catheterization this admission for definitive evaluation (likely tomorrow as cath schedule is full today).   2. CAD - s/p prior stent placement in 2004. Will request records from [facility].  - continue ASA and Plavix. Start BB as above. Lipid Panel pending.   3. PVD - prior right iliac stenting in 2004.  - denies any recent claudication symptoms.  - continue ASA and Plavix.   4. Tobacco use/ Alcohol use - smokes 0.5-1.0 ppd (30 pack-year history) and consumes 3-4 beers per day.  - cessation advised.    Signed, Ellsworth Lennox, PA-C 04/19/2016, 1:07 PM Pager: [phone] Patient seen and examined and history reviewed. Agree with above findings and plan. 56 yo WM with history of CAD s/p remote stent in 2004 in [place]. Also history of PAD s/p right iliac stent. Ongoing tobacco abuse. On ASA and Plavix but otherwise on no medication. Awoke with severe left precordial pain. No radiation. 10/10. Associated with SOB and diaphoresis. States it is the worst pain he has ever had. Relieved after SL Ntg x 3. Patient does describe some chronic claudication symptoms. Tried to quit smoking in past using Chantix without success.  On exam he is in no distress No JVD or carotid bruits. Lungs clear CV RRR without gallop or murmur.  Ecg shows NSR with normal Ecg. I have personally reviewed and interpreted this study. Troponin initially normal CT chest shows severe calcification in LAD- I suspect this is location of stent  Impression:  Unstable angina in patient at high risk for cardiac cause of chest pain. - will admit. Start IV heparin, beta  blocker, statin.  - plan cardiac cath tomorrow with possible PCi. The procedure and risks were reviewed including but not limited to death, myocardial infarction, stroke, arrythmias, bleeding, transfusion, emergency surgery, dye allergy, or renal dysfunction. The patient voices understanding and is agreeable to proceed.  PAD. Stable claudication. Recommend LE arterial dopplers at some point  Tobacco abuse- counseled on smoking cessation  Lipid status unknown. Will check panel. Needs statin therapy.  Mateo Overbeck Swaz[place], MDFACC 04/19/2016 3:05 PM

## 2016-04-20 NOTE — Interval H&P Note (Signed)
History and Physical Interval Note:  04/20/2016 7:24 AM  Andrew Sherman  has presented today for surgery, with the diagnosis of unstable angina  The various methods of treatment have been discussed with the patient and family. After consideration of risks, benefits and other options for treatment, the patient has consented to  Procedure(s): Left Heart Cath and Coronary Angiography (N/A) as a surgical intervention .  The patient's history has been reviewed, patient examined, no change in status, stable for surgery.  I have reviewed the patient's chart and labs.  Questions were answered to the patient's satisfaction.   Cath Lab Visit (complete for each Cath Lab visit)  Clinical Evaluation Leading to the Procedure:   ACS: Yes.    Non-ACS:    Anginal Classification: CCS IV  Anti-ischemic medical therapy: No Therapy  Non-Invasive Test Results: No non-invasive testing performed  Prior CABG: No previous CABG        Theron Arista [facility] 04/20/2016 7:24 AM

## 2016-04-20 NOTE — Discharge Instructions (Signed)
You were admitted to the hospital for chest pain.  We determined that you did not have a heart attack, and the catheterization of your heart did not show any severe blockages that needed to be treated.  I am not sure what caused the chest pain; it could have been from your heart, or it could have been something like acid reflux or pain in your chest muscles.  To reduce your risk of heart attacks, I have prescribed you 2 new medicines, metoprolol and atorvastatin.  Quitting smoking, however, is the most important thing you can do.  Please follow up in the Internal Medicine Center in 1-2 weeks and with the cardiologist after that.  If you have chest pain or trouble breathing again, please call 911 and come back to the ED.

## 2016-04-20 NOTE — Discharge Summary (Signed)
Name: Andrew Sherman MRN: [id number] DOB: Jan 10, 1961 56 y.o. PCP: No Pcp Per Patient  Date of Admission: 04/19/2016  3:47 AM Date of Discharge: 04/20/2016 Attending Physician: Earl Lagos, MD  Discharge Diagnosis:  Principal Problem:   Unstable angina [facility]) Active Problems:   Chest pain   History of MI (myocardial infarction)   History of CVA (cerebrovascular accident)   PVD (peripheral vascular disease) (HCC)   Tobacco abuse   Discharge Medications: Allergies as of 04/20/2016   No Known Allergies     Medication List    TAKE these medications   aspirin EC 81 MG tablet Take 81 mg by mouth daily.   atorvastatin 40 MG tablet Commonly known as:  LIPITOR Take 1 tablet (40 mg total) by mouth daily at 6 PM.   clopidogrel 75 MG tablet Commonly known as:  PLAVIX Take 75 mg by mouth daily.   metoprolol succinate 25 MG 24 hr tablet Commonly known as:  TOPROL XL Take 1 tablet (25 mg total) by mouth daily.   nitroGLYCERIN 0.4 MG SL tablet Commonly known as:  NITROSTAT Place 1 tablet (0.4 mg total) under the tongue every 5 (five) minutes as needed for chest pain.       Disposition and follow-up:   Andrew Sherman was discharged from [facility] in Stable condition.  At the hospital follow up visit please address:  1.  Chest Pain and CAD.  Ask about further chest pain.  Encourage adherence to new prescriptions for metoprolol and atorvastatin.  Facilitate cardiology follow-up.  2.  Pre-Diabetes.  A1c 5.8%.  Discuss lifestyle and dietary modifications.  3.  Smoking Cessation.  Encourage abstinence.  Offer medical support.  4.  Labs / imaging needed at time of follow-up: none  5.  Pending labs/ test needing follow-up: HIV  Follow-up Appointments: Follow-up Information    [facility]. Schedule an appointment as soon as possible for a visit in 1 week(s).   Why:  They will call you on Monday to make appointment in 1-2 weeks.  If  you do not hear from them, please call to schedule. Contact information: [address] [place] [address] [phone]       Azalee Course, [place]. Go on 05/04/2016.   Specialties:  Cardiology, Radiology Why:   for post hospital (Dr. Elvis Coil PA) Contact information: [address] [place] [address] [phone]           Hospital Course by problem list: Principal Problem:   Unstable angina [facility]) Active Problems:   Chest pain   History of MI (myocardial infarction)   History of CVA (cerebrovascular accident)   PVD (peripheral vascular disease) (HCC)   Tobacco abuse   1. Chest Pain Andrew Sherman is a 56 year old man with CAD, PAD, tobacco abuse, and history of CVA who presented with sudden onset substernal chest pain concerning for ACS vs unstable angina.  MI was ruled out with serial negative troponins and nonischemic EKG, and cardiac catheterization showed non-obstructive CAD.  His symptoms completely resolved overnight.  He was prescribed beta blocker and statin in addition to his prior-to-admission aspirin and plavix and discharge home to follow-up in [facility] and with cardiology.  Discharge Vitals:   BP 139/86   Pulse 77   Temp 97.9 F (36.6 C) (Oral)   Resp 10   Ht  (1.778 m)   Wt 184 lb (83.5 kg)   SpO2 97%   BMI 26.40 kg/m  Pertinent Labs, Studies, and Procedures:   Lipid Panel     Component Value Date/Time   CHOL 165 04/20/2016 0006   TRIG 70 04/20/2016 0006   HDL 45 04/20/2016 0006   CHOLHDL 3.7 04/20/2016 0006   VLDL 14 04/20/2016 0006   LDLCALC 106 (H) 04/20/2016 0006   Lab Results  Component Value Date   HGBA1C 5.8 (H) 04/19/2016     Left Heart Cath and Coronary Angiography 04/20/2016 By Peter Swaziland, MD  Prox LAD to Mid LAD lesion, 25 %stenosed.  Prox RCA to Mid RCA lesion, 10 %stenosed.  The left ventricular ejection fraction is 50-55% by visual estimate.  The left ventricular systolic function is  normal.  LV end diastolic pressure is normal.   1. Nonobstructive CAD 2. Low normal LV systolic function 3. Normal LVEDP  Plan: medical management with risk factor modification. Patient may be DC today post TR band removal. Recommend follow up in our office. Will need LE and Abdominal dopplers as outpatient to follow up on PAD.  Discharge Instructions: Discharge Instructions    Diet - low sodium heart healthy    Complete by:  As directed    Increase activity slowly    Complete by:  As directed     You were admitted to the hospital for chest pain.  We determined that you did not have a heart attack, and the catheterization of your heart did not show any severe blockages that needed to be treated.  I am not sure what caused the chest pain; it could have been from your heart, or it could have been something like acid reflux or pain in your chest muscles.  To reduce your risk of heart attacks, I have prescribed you 2 new medicines, metoprolol and atorvastatin.  Quitting smoking, however, is the most important thing you can do.  Please follow up in the Internal Medicine Center in 1-2 weeks and with the cardiologist after that.  If you have chest pain or trouble breathing again, please call 911 and come back to the ED.  Signed: Alm Bustard, MD 04/20/2016, 10:46 AM   Pager: [phone]

## 2016-05-04 ENCOUNTER — Ambulatory Visit: Payer: Self-pay | Admitting: Physician Assistant

## 2016-05-04 ENCOUNTER — Encounter: Payer: Self-pay | Admitting: *Deleted

## 2016-05-04 NOTE — Progress Notes (Deleted)
Cardiology Office Note    Date:  05/04/2016   ID:  Andrew Sherman, DOB [birth year], MRN [id number]  PCP:  No PCP Per Patient  Cardiologist:  Dr. Swaziland   No chief complaint on file.   History of Present Illness:  Andrew Sherman is a 56 y.o. male with PMH of CAD, PVD (prior R iliac stenting in 2011), Tobacco use, and alcohol use who presented to the hospital on 04/19/2016 for evaluation of chest pain. Prior to that, he had cardiac catheterization admission Hospital in [place] in 2011 after a abdominal cardiac CT. He was found to have nonobstructive CAD at the time. 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 He has not seen by up to make care doctor or cardiologist for many years. He also had signs and symptoms of stable claudication. His chest symptom was concerning for unstable angina. CT angiogram of the chest was negative for PE, it does show coronary artery calcification. He underwent cardiac catheterization on 04/20/2016, this showed 25% proximal to mid LAD lesion, EF 50-55%, otherwise no culprit lesion was identified. Labs obtained in the hospital showed hemoglobin A1c 5.8 which put him in the borderline prediabetes range. And also lipid panel obtained on 04/20/2016 showed LDL 106, HDL 45, cholesterol 165, triglycerides 70. He is on moderate dose Lipitor 40 mg daily.  He will need a lower extremity and abdominal Doppler on follow-up  No EKG   Past Medical History:  Diagnosis Date  . CAD (coronary artery disease)   . PVD (peripheral vascular disease) (HCC)     Past Surgical History:  Procedure Laterality Date  . LEFT HEART CATH AND CORONARY ANGIOGRAPHY N/A 04/20/2016   Procedure: Left Heart Cath and Coronary Angiography;  Surgeon: Peter M Swaziland, MD;  Location: [facility] INVASIVE CV LAB;  Service: Cardiovascular;  Laterality: N/A;    Current Medications: Outpatient Medications Prior to Visit  Medication Sig  Dispense Refill  . aspirin EC 81 MG tablet Take 81 mg by mouth daily.    Marland Kitchen atorvastatin (LIPITOR) 40 MG tablet Take 1 tablet (40 mg total) by mouth daily at 6 PM. 30 tablet 0  . clopidogrel (PLAVIX) 75 MG tablet Take 75 mg by mouth daily.    . metoprolol succinate (TOPROL XL) 25 MG 24 hr tablet Take 1 tablet (25 mg total) by mouth daily. 30 tablet 0  . nitroGLYCERIN (NITROSTAT) 0.4 MG SL tablet Place 1 tablet (0.4 mg total) under the tongue every 5 (five) minutes as needed for chest pain. 30 tablet 0   No facility-administered medications prior to visit.      Allergies:   Patient has no known allergies.   Social History   Social History  . Marital status: Single    Spouse name: N/A  . Number of children: N/A  . Years of education: N/A   Social History Main Topics  . Smoking status: Current Some Day Smoker    Packs/day: 0.50    Years: 30.00    Types: Cigarettes  . Smokeless tobacco: Never Used  . Alcohol use 12.6 oz/week    21 Cans of beer per week  . Drug use: No  . Sexual activity: Not on file   Other Topics Concern  . Not on file   Social History Narrative  . No narrative on file     Family History:  The patient's ***family history includes Hypertension in his father.  ROS:   Please see the history of present illness.    ROS All other systems reviewed and are negative.   PHYSICAL EXAM:   VS:  There were no vitals taken for this visit.   GEN: Well nourished, well developed, in no acute distress  HEENT: normal  Neck: no JVD, carotid bruits, or masses Cardiac: ***RRR; no murmurs, rubs, or gallops,no edema  Respiratory:  clear to auscultation bilaterally, normal work of breathing GI: soft, nontender, nondistended, + BS MS: no deformity or atrophy  Skin: warm and dry, no rash Neuro:  Alert and Oriented x 3, Strength and sensation are intact Psych: euthymic mood, full affect  Wt Readings from Last 3 Encounters:  04/19/16 184 lb (83.5 kg)      Studies/Labs  Reviewed:   EKG:  EKG is*** ordered today.  The ekg ordered today demonstrates ***  Recent Labs: 04/20/2016: BUN 10; Creatinine, Ser 0.79; Hemoglobin 13.8; Platelets 243; Potassium 3.8; Sodium 141   Lipid Panel    Component Value Date/Time   CHOL 165 04/20/2016 0006   TRIG 70 04/20/2016 0006   HDL 45 04/20/2016 0006   CHOLHDL 3.7 04/20/2016 0006   VLDL 14 04/20/2016 0006   LDLCALC 106 (H) 04/20/2016 0006    Additional studies/ records that were reviewed today include:   CTA of chest 04/19/2016 IMPRESSION: 1. No pulmonary embolus. 2. Mild bronchial thickening. 3. Coronary artery calcifications. Mild atherosclerosis of the thoracic aorta.  Cath 04/10/2016 Conclusion     Prox LAD to Mid LAD lesion, 25 %stenosed.  Prox RCA to Mid RCA lesion, 10 %stenosed.  The left ventricular ejection fraction is 50-55% by visual estimate.  The left ventricular systolic function is normal.  LV end diastolic pressure is normal.   1. Nonobstructive CAD 2. Low normal LV systolic function 3. Normal LVEDP  Plan: medical management with risk factor modification. Patient may be DC today post TR band removal. Recommend follow up in our office. Will need LE and Abdominal dopplers as outpatient to follow up on PAD.      ASSESSMENT:    No diagnosis found.   PLAN:  In order of problems listed above:  1. ***    Medication Adjustments/Labs and Tests Ordered: Current medicines are reviewed at length with the patient today.  Concerns regarding medicines are outlined above.  Medication changes, Labs and Tests ordered today are listed in the Patient Instructions below. There are no Patient Instructions on file for this visit.   Ramond Dial, [place]  05/04/2016 7:12 AM    [facility] [address] [place], [place], [place]  [address] Phone: [phone]; Fax: [phone]

## 2018-11-15 IMAGING — CT CT ANGIO CHEST
2 of 8 series · 18 of 46 positions shown · IV contrast (OMNI)
Comparison: Chest radiograph earlier this day

CLINICAL DATA: Chest pain and shortness of breath. History of right
leg DVT.

EXAM:
CT ANGIOGRAPHY CHEST WITH CONTRAST
TECHNIQUE: Multidetector CT imaging of the chest was performed using the
standard protocol during bolus administration of intravenous
contrast. Multiplanar CT image reconstructions and MIPs were
obtained to evaluate the vascular anatomy.
CONTRAST:  100 cc Isovue 370 IV

[Series 6: thins · axial · 0.76mm/px · z∈[-312,-46]mm · 15 of 294 slices shown]
[im 14/294  lung]
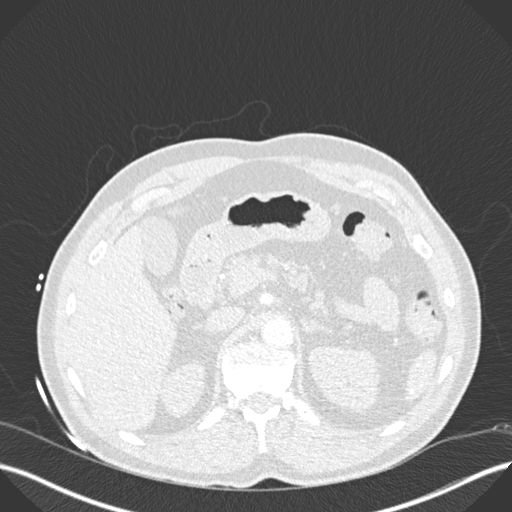
[im 40/294  soft-tissue]
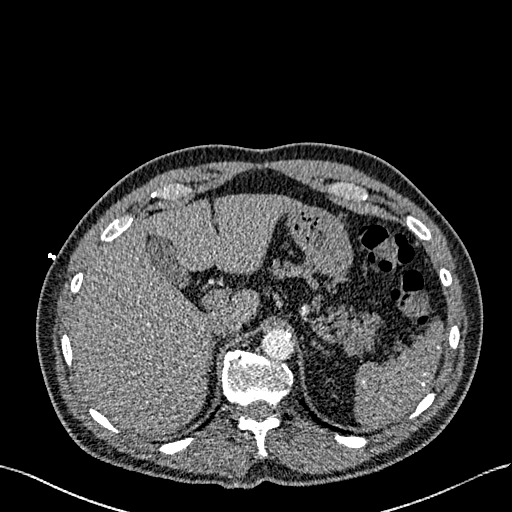
[im 54/294  lung]
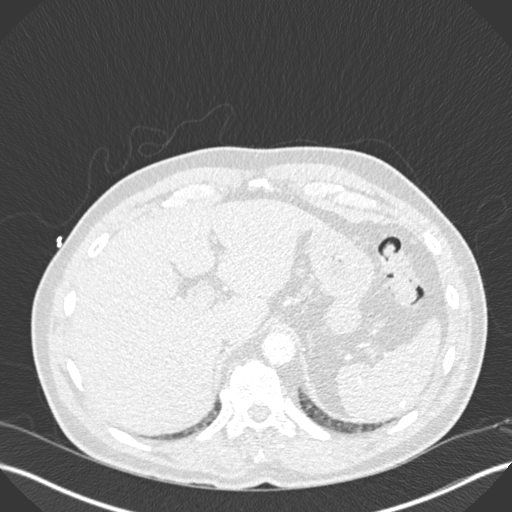
[im 67/294  soft-tissue]
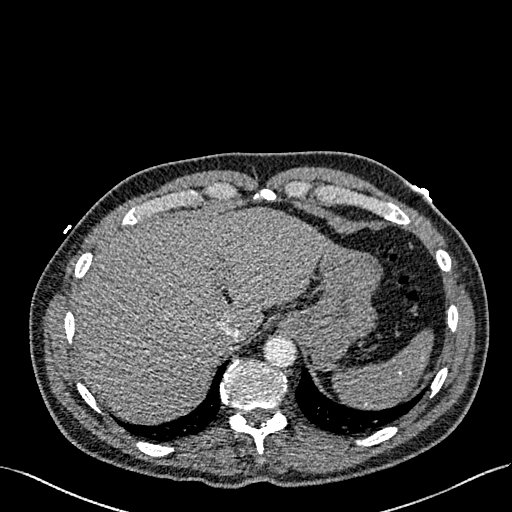
[im 94/294  lung]
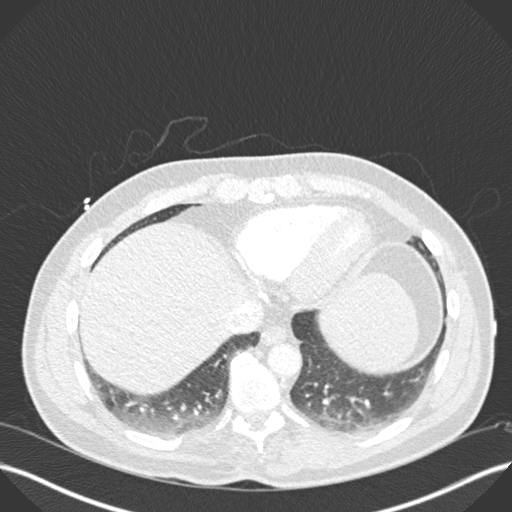
[im 107/294  soft-tissue]
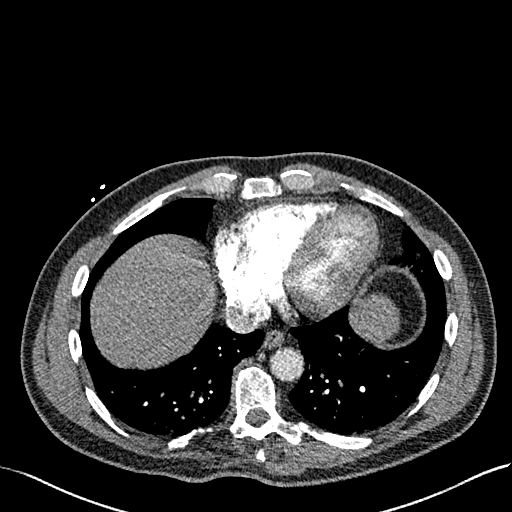
[im 134/294  lung]
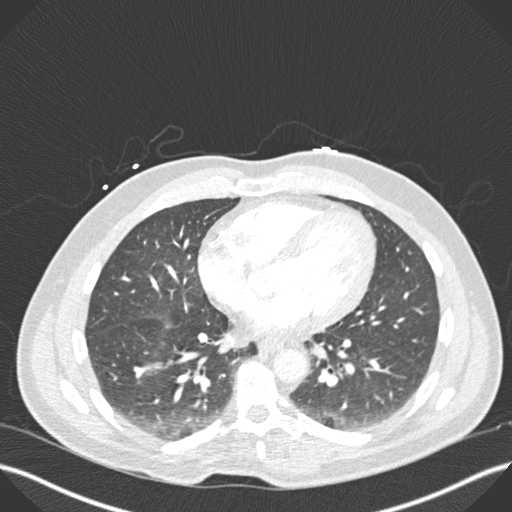
[im 147/294  soft-tissue]
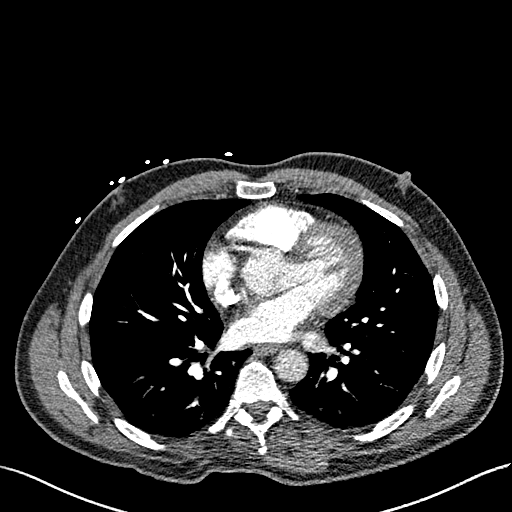
[im 160/294  lung]
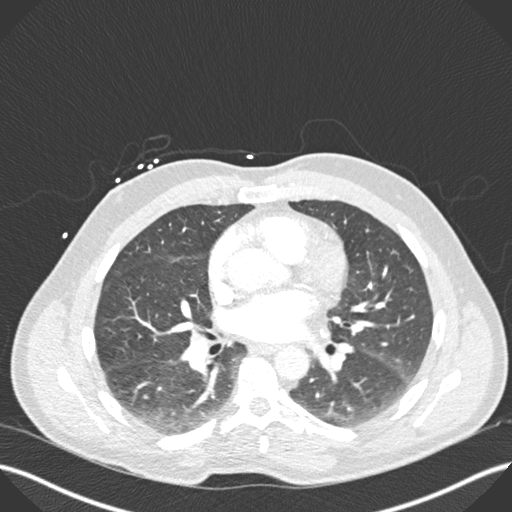
[im 187/294  soft-tissue]
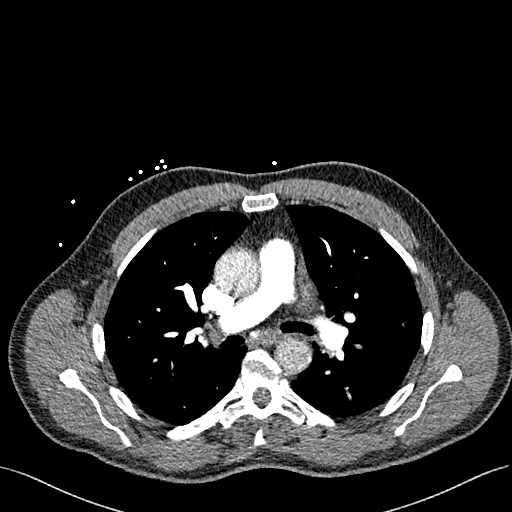
[im 200/294  lung]
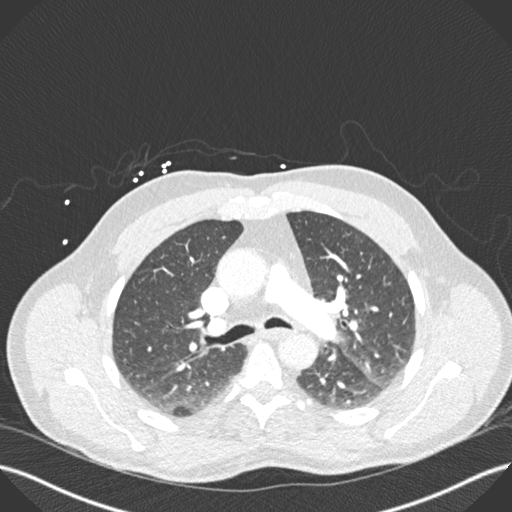
[im 227/294  soft-tissue]
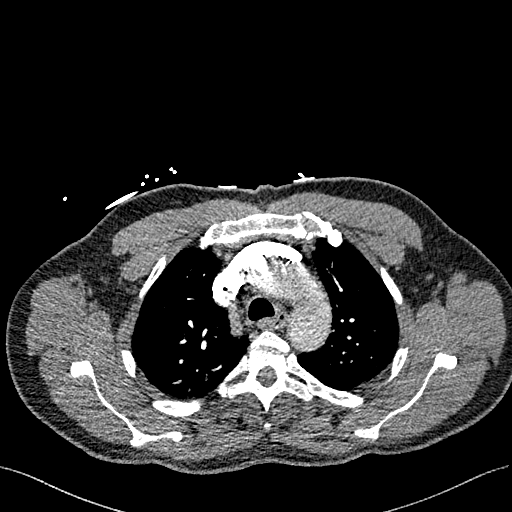
[im 240/294  lung]
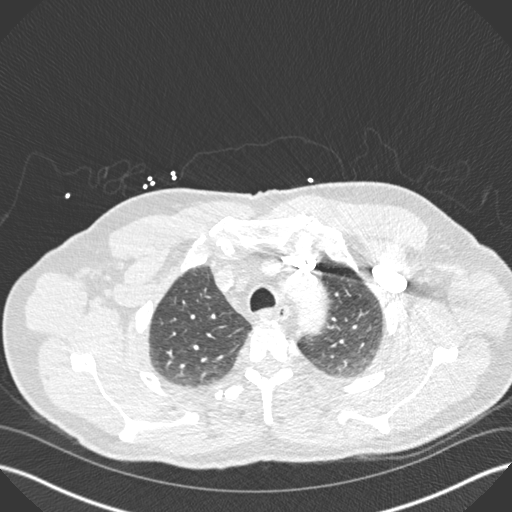
[im 254/294  soft-tissue]
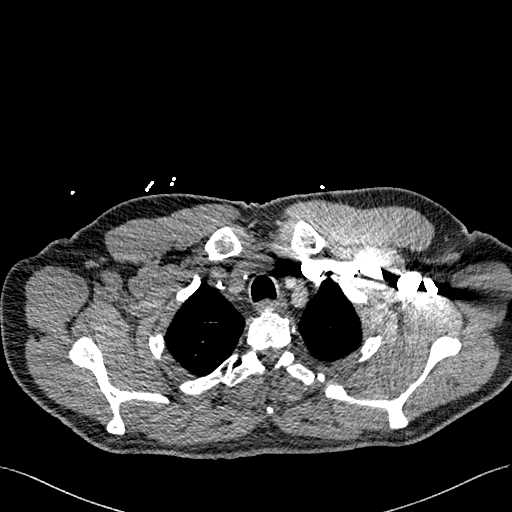
[im 280/294  lung]
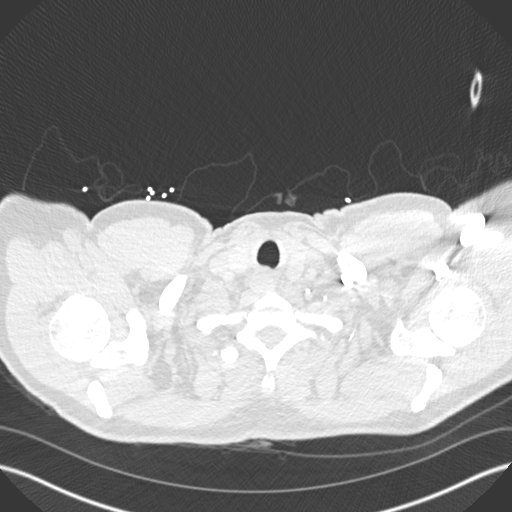

[Series 8: coronal mpr · coronal · 0.57mm/px · 3 of 151 slices shown]
[im 38/151  soft-tissue]
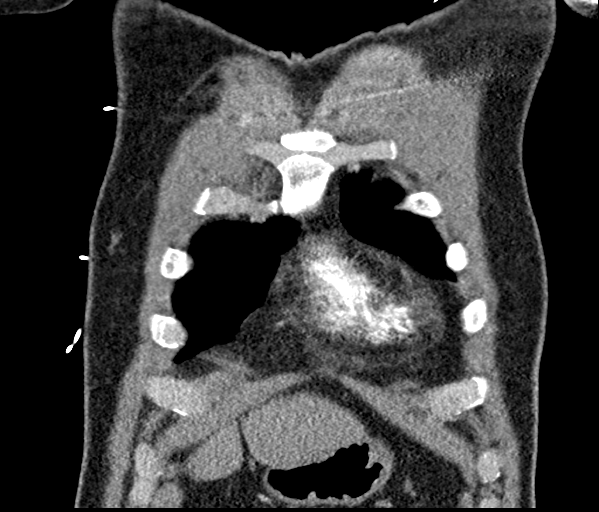
[im 76/151  soft-tissue]
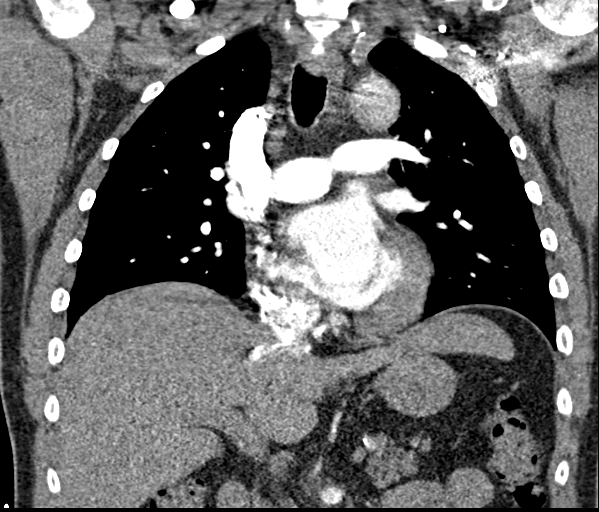
[im 113/151  soft-tissue]
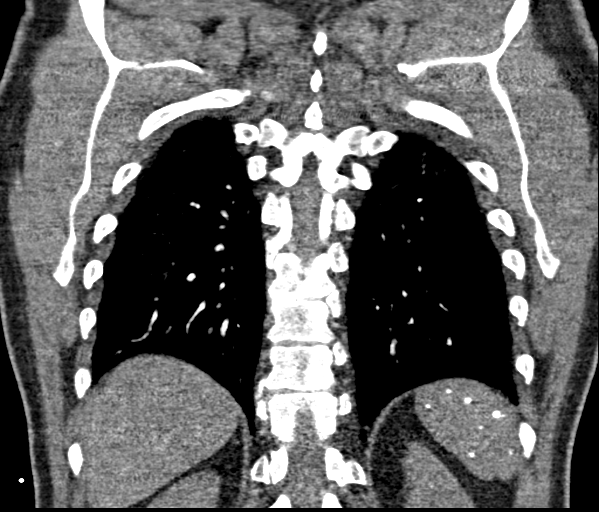

[18 of 46 positions shown; findings below may reference images not displayed]

FINDINGS: Cardiovascular: There are no filling defects within the pulmonary
arteries to suggest pulmonary embolus. The thoracic aorta is normal
in caliber. Minimal atherosclerosis. Coronary artery calcifications
are seen.

Mediastinum/Nodes: Calcified mediastinal on nodes left hilar lymph
nodes consistent prior granulomatous disease. No noncalcified
adenopathy. Visualized thyroid gland is normal. The esophagus is
decompressed.

Lungs/Pleura: Linear atelectasis in the left lower lobe. Mild
central bronchial thickening. No consolidation or evidence pulmonary
edema. No pleural fluid. No pulmonary mass or suspicious nodule.
Calcified granuloma in the left lower lobe.

Upper Abdomen: Calcified splenic granuloma.  No acute abnormality.

Musculoskeletal: There are no acute or suspicious osseous
abnormalities. Degenerative change in the thoracic spine.

Review of the MIP images confirms the above findings.
IMPRESSION: 1. No pulmonary embolus.
2. Mild bronchial thickening.
3. Coronary artery calcifications. Mild atherosclerosis of the
thoracic aorta.
